# Patient Record
Sex: Female | Born: 1990 | Race: White | Hispanic: No | Marital: Single | Smoking: Never smoker
Health system: Southern US, Community
[De-identification: ages and names within clinical notes are randomized; demographics above are authoritative.]

## PROBLEM LIST (undated history)

## (undated) HISTORY — PX: HIP SURGERY: SHX245

## (undated) HISTORY — PX: CLEFT PALATE REPAIR: SUR1165

---

## 2014-02-16 ENCOUNTER — Emergency Department (HOSPITAL_COMMUNITY): Payer: Medicaid Other

## 2014-02-16 ENCOUNTER — Emergency Department (HOSPITAL_COMMUNITY)
Admission: EM | Admit: 2014-02-16 | Discharge: 2014-02-17 | Disposition: A | Discharge status: Home / Self Care | Payer: Medicaid Other | Attending: Emergency Medicine | Admitting: Emergency Medicine

## 2014-02-16 ENCOUNTER — Encounter (HOSPITAL_COMMUNITY): Payer: Self-pay | Admitting: Emergency Medicine

## 2014-02-16 DIAGNOSIS — N830 Follicular cyst of ovary: Secondary | ICD-10-CM | POA: Diagnosis not present

## 2014-02-16 DIAGNOSIS — R102 Pelvic and perineal pain: Secondary | ICD-10-CM

## 2014-02-16 DIAGNOSIS — Z3202 Encounter for pregnancy test, result negative: Secondary | ICD-10-CM | POA: Diagnosis not present

## 2014-02-16 DIAGNOSIS — R197 Diarrhea, unspecified: Secondary | ICD-10-CM | POA: Insufficient documentation

## 2014-02-16 DIAGNOSIS — N83201 Unspecified ovarian cyst, right side: Secondary | ICD-10-CM

## 2014-02-16 DIAGNOSIS — R109 Unspecified abdominal pain: Secondary | ICD-10-CM | POA: Diagnosis present

## 2014-02-16 LAB — COMPREHENSIVE METABOLIC PANEL
ALT: 9 U/L (ref 0–35)
AST: 13 U/L (ref 0–37)
Albumin: 4.4 g/dL (ref 3.5–5.2)
Alkaline Phosphatase: 69 U/L (ref 39–117)
Anion gap: 13 (ref 5–15)
BUN: 7 mg/dL (ref 6–23)
CALCIUM: 9.3 mg/dL (ref 8.4–10.5)
CO2: 22 meq/L (ref 19–32)
Chloride: 102 mEq/L (ref 96–112)
Creatinine, Ser: 0.8 mg/dL (ref 0.50–1.10)
GFR calc Af Amer: >90 mL/min (ref >90)
GLUCOSE: 86 mg/dL (ref 70–99)
Potassium: 3.7 mEq/L (ref 3.7–5.3)
Sodium: 137 mEq/L (ref 137–147)
TOTAL PROTEIN: 8.2 g/dL (ref 6.0–8.3)
Total Bilirubin: 0.3 mg/dL (ref 0.3–1.2)

## 2014-02-16 LAB — URINALYSIS, ROUTINE W REFLEX MICROSCOPIC
BILIRUBIN URINE: NEGATIVE
Glucose, UA: NEGATIVE mg/dL
KETONES UR: NEGATIVE mg/dL
Leukocytes, UA: NEGATIVE
NITRITE: NEGATIVE
PROTEIN: NEGATIVE mg/dL
SPECIFIC GRAVITY, URINE: 1.01 (ref 1.005–1.030)
UROBILINOGEN UA: 0.2 mg/dL (ref 0.0–1.0)
pH: 6.5 (ref 5.0–8.0)

## 2014-02-16 LAB — CBC WITH DIFFERENTIAL/PLATELET
BASOS ABS: 0 10*3/uL (ref 0.0–0.1)
Basophils Relative: 0 % (ref 0–1)
EOS ABS: 0.3 10*3/uL (ref 0.0–0.7)
EOS PCT: 3 % (ref 0–5)
HEMATOCRIT: 37.4 % (ref 36.0–46.0)
Hemoglobin: 12.5 g/dL (ref 12.0–15.0)
Lymphocytes Relative: 29 % (ref 12–46)
Lymphs Abs: 2.2 10*3/uL (ref 0.7–4.0)
MCH: 27 pg (ref 26.0–34.0)
MCHC: 33.4 g/dL (ref 30.0–36.0)
MCV: 80.8 fL (ref 78.0–100.0)
Monocytes Absolute: 0.4 10*3/uL (ref 0.1–1.0)
Monocytes Relative: 5 % (ref 3–12)
Neutro Abs: 4.8 10*3/uL (ref 1.7–7.7)
Neutrophils Relative %: 63 % (ref 43–77)
Platelets: 244 10*3/uL (ref 150–400)
RBC: 4.63 MIL/uL (ref 3.87–5.11)
RDW: 12.9 % (ref 11.5–15.5)
WBC: 7.6 10*3/uL (ref 4.0–10.5)

## 2014-02-16 LAB — POC URINE PREG, ED: Preg Test, Ur: NEGATIVE

## 2014-02-16 LAB — WET PREP, GENITAL
Trich, Wet Prep: NONE SEEN
Yeast Wet Prep HPF POC: NONE SEEN

## 2014-02-16 LAB — URINE MICROSCOPIC-ADD ON

## 2014-02-16 LAB — LIPASE, BLOOD: Lipase: 29 U/L (ref 11–59)

## 2014-02-16 MED ORDER — IOHEXOL 300 MG/ML  SOLN
50.0000 mL | Freq: Once | INTRAMUSCULAR | Status: AC | PRN
  Administered 2014-02-16: 50 mL via ORAL

## 2014-02-16 MED ORDER — IOHEXOL 300 MG/ML  SOLN
80.0000 mL | Freq: Once | INTRAMUSCULAR | Status: AC | PRN
  Administered 2014-02-16: 80 mL via INTRAVENOUS

## 2014-02-16 NOTE — ED Notes (Signed)
US at bedside

## 2014-02-16 NOTE — ED Notes (Signed)
Pelvic is set up 

## 2014-02-16 NOTE — ED Provider Notes (Signed)
23 year old female, history of lower abdominal pain, on exam the patient initially had right lower quadrant tenderness, see nurse practitioners note for pelvic exam.  Has improved pain with meds, CT shows cyst, on my exam no ttp at this time.  US pending to r/o torsion.  Medical screening examination/treatment/procedure(s) were conducted as a shared visit with non-physician practitioner(s) and myself.  I personally evaluated the patient during the encounter.  Clinical Impression:   Final diagnoses:  Pelvic pain in female         Vida RollerBrian D Kameka Whan, MD 02/18/14 [phone]

## 2014-02-16 NOTE — ED Notes (Signed)
Bed: [id number] Expected date:  Expected time:  Means of arrival:  Comments: Hold for [place]czak

## 2014-02-16 NOTE — ED Notes (Signed)
Pt presents with c/o right side pelvic pain that started approx 4-5 days ago. Pt denies any burning with urination or hematuria. Pt reports some diarrhea but no nausea or vomiting. Pt reports some white vaginal discharge as well. NAD at this time.

## 2014-02-16 NOTE — ED Provider Notes (Signed)
CSN: [id number]     Arrival date & time 02/16/14  1537 History   First MD Initiated Contact with Patient 02/16/14 1822     Chief Complaint  Patient presents with  . Pelvic Pain   (Consider location/radiation/quality/duration/timing/severity/associated sxs/prior Treatment) HPI Gloria Phillips is a 23 yo female presenting with RLQ pain x 5 days.  She states she was cleaning and felt like it hurt to walk, or move.  She notes the pain improved some when lying down, but was still painful.  The next day she had some vaginal spotting, and copious white discharge but does not note an odor.  She denies dysuria but does note pain with intercourse.  Her LMP was in September and she had a week of bleeding and then off and another few days of light bleeding but this is not uncommon since being on her Implanon.  She does endorse loose stools x 3 days, but denies nausea, vomiting, fevers or chills.    History reviewed. No pertinent past medical history. Past Surgical History  Procedure Laterality Date  . Cleft palate repair    . Hip surgery     No family history on file. History  Substance Use Topics  . Smoking status: Never Smoker   . Smokeless tobacco: Not on file  . Alcohol Use: Yes     Comment: rarely    OB History   Grav Para Term Preterm Abortions TAB SAB Ect Mult Living                 Review of Systems  Constitutional: Negative for fever and chills.  HENT: Negative for sore throat.   Eyes: Negative for visual disturbance.  Respiratory: Negative for cough and shortness of breath.   Cardiovascular: Negative for chest pain and leg swelling.  Gastrointestinal: Positive for diarrhea. Negative for nausea and vomiting.  Genitourinary: Positive for vaginal discharge and pelvic pain. Negative for dysuria.  Musculoskeletal: Negative for myalgias.  Skin: Negative for rash.  Neurological: Negative for weakness, numbness and headaches.    Allergies  Other  Home Medications   Prior to  Admission medications   Not on File   BP 112/67  Pulse 102  Temp(Src) 98.6 F (37 C) (Oral)  Resp 16  SpO2 100%  LMP 01/27/2014 Physical Exam  Nursing note and vitals reviewed. Constitutional: She appears well-developed and well-nourished. No distress.  HENT:  Head: Normocephalic and atraumatic.  Mouth/Throat: Oropharynx is clear and moist. No oropharyngeal exudate.  Eyes: Conjunctivae are normal. No scleral icterus.  Neck: Neck supple. No thyromegaly present.  Cardiovascular: Normal rate, regular rhythm, S1 normal, S2 normal and intact distal pulses.  Exam reveals no gallop and no friction rub.   No murmur heard. On exam, heart rate is palpated 92 bpm.  Pulmonary/Chest: Effort normal and breath sounds normal. No respiratory distress. She has no wheezes. She has no rales. She exhibits no tenderness.  Abdominal: Soft. She exhibits no distension and no mass. There is no hepatosplenomegaly. There is tenderness in the right lower quadrant. There is tenderness at McBurney's point. There is no rigidity, no rebound, no guarding, no CVA tenderness and negative Murphy's sign.    Palpation at LLQ and periumbilical causes pain in RLQ.  Genitourinary: Cervix exhibits discharge. Right adnexum displays tenderness. Left adnexum displays no tenderness and no fullness. Vaginal discharge found.  Musculoskeletal: She exhibits no tenderness.  Lymphadenopathy:    She has no cervical adenopathy.  Neurological: She is alert.  Skin: Skin is warm  and dry. No rash noted. She is not diaphoretic.  Psychiatric: She has a normal mood and affect.    ED Course  Procedures (including critical care time) Labs Review Labs Reviewed  WET PREP, GENITAL - Abnormal; Notable for the following:    Clue Cells Wet Prep HPF POC FEW (*)    WBC, Wet Prep HPF POC FEW (*)    All other components within normal limits  URINALYSIS, ROUTINE W REFLEX MICROSCOPIC - Abnormal; Notable for the following:    APPearance CLOUDY (*)     Hgb urine dipstick MODERATE (*)    All other components within normal limits  URINE MICROSCOPIC-ADD ON - Abnormal; Notable for the following:    Squamous Epithelial / LPF FEW (*)    All other components within normal limits  GC/CHLAMYDIA PROBE AMP  CBC WITH DIFFERENTIAL  COMPREHENSIVE METABOLIC PANEL  LIPASE, BLOOD  RPR  HIV ANTIBODY (ROUTINE TESTING)  POC URINE PREG, ED    Imaging Review US Transvaginal Non-ob  02/16/2014   CLINICAL DATA:  Right-sided pelvic pain for 5 days. Evaluate for ovarian torsion.  EXAM: TRANSABDOMINAL AND TRANSVAGINAL ULTRASOUND OF PELVIS  DOPPLER ULTRASOUND OF OVARIES  TECHNIQUE: Both transabdominal and transvaginal ultrasound examinations of the pelvis were performed. Transabdominal technique was performed for global imaging of the pelvis including uterus, ovaries, adnexal regions, and pelvic cul-de-sac.  It was necessary to proceed with endovaginal exam following the transabdominal exam to visualize the the uterus, endometrium, bilateral ovaries and adnexa. Color and duplex Doppler ultrasound was utilized to evaluate blood flow to the ovaries.  COMPARISON:  CT abdomen and pelvis- 02/16/2014.  FINDINGS: Uterus  Normal size and appearance of the interview did uterus which measures approximately 8.3 x 3.4 x 3.9 cm. No discrete uterine mass.  Endometrium  Normal thickness of the endometrium measuring approximately 0.4 cm in diameter. No discrete endometrial mass.  Right ovary  Enlarged measures 6.9 x 5.5 x 6.7 cm, secondary to a large (approximately 5.6 x 5.5 x 5.3 cm) ovarian largely anechoic lesion demonstrates low level echoes. Normal low resistance arterial and venous waveforms are demonstrated within in the surrounding normal ovarian tissue.  Left ovary  Normal in size measuring 2.4 x 2.4 x 1 1.0 cm. Several tiny sub cm follicles are noted within the periphery of the left ovary. Normal low resistance arterial and venous waveforms are demonstrated within in the left  ovary.  Other findings  No free fluid.  IMPRESSION: 1. No evidence of ovarian torsion. 2. There is an approximately 5.6 cm right-sided adnexal lesion which demonstrates low-level internal echoes and while potentially representative of an endometrioma, a minimally complex hemorrhagic cyst could have a similar appearance. As such, a follow-up pelvic ultrasound in 6 weeks is recommended to ensure stability and/or resolution.   Electronically Signed   By: Simonne Come M.D.   On: 02/16/2014 23:50   US Pelvis Complete  02/16/2014   CLINICAL DATA:  Right-sided pelvic pain for 5 days. Evaluate for ovarian torsion.  EXAM: TRANSABDOMINAL AND TRANSVAGINAL ULTRASOUND OF PELVIS  DOPPLER ULTRASOUND OF OVARIES  TECHNIQUE: Both transabdominal and transvaginal ultrasound examinations of the pelvis were performed. Transabdominal technique was performed for global imaging of the pelvis including uterus, ovaries, adnexal regions, and pelvic cul-de-sac.  It was necessary to proceed with endovaginal exam following the transabdominal exam to visualize the the uterus, endometrium, bilateral ovaries and adnexa. Color and duplex Doppler ultrasound was utilized to evaluate blood flow to the ovaries.  COMPARISON:  CT abdomen  and pelvis- 02/16/2014.  FINDINGS: Uterus  Normal size and appearance of the interview did uterus which measures approximately 8.3 x 3.4 x 3.9 cm. No discrete uterine mass.  Endometrium  Normal thickness of the endometrium measuring approximately 0.4 cm in diameter. No discrete endometrial mass.  Right ovary  Enlarged measures 6.9 x 5.5 x 6.7 cm, secondary to a large (approximately 5.6 x 5.5 x 5.3 cm) ovarian largely anechoic lesion demonstrates low level echoes. Normal low resistance arterial and venous waveforms are demonstrated within in the surrounding normal ovarian tissue.  Left ovary  Normal in size measuring 2.4 x 2.4 x 1 1.0 cm. Several tiny sub cm follicles are noted within the periphery of the left ovary.  Normal low resistance arterial and venous waveforms are demonstrated within in the left ovary.  Other findings  No free fluid.  IMPRESSION: 1. No evidence of ovarian torsion. 2. There is an approximately 5.6 cm right-sided adnexal lesion which demonstrates low-level internal echoes and while potentially representative of an endometrioma, a minimally complex hemorrhagic cyst could have a similar appearance. As such, a follow-up pelvic ultrasound in 6 weeks is recommended to ensure stability and/or resolution.   Electronically Signed   By: Simonne Come M.D.   On: 02/16/2014 23:50   Ct Abdomen Pelvis W Contrast  02/16/2014   CLINICAL DATA:  Right lower quadrant pelvic pain.  Initial encounter  EXAM: CT ABDOMEN AND PELVIS WITH CONTRAST  TECHNIQUE: Multidetector CT imaging of the abdomen and pelvis was performed using the standard protocol following bolus administration of intravenous contrast.  CONTRAST:  80mL OMNIPAQUE IOHEXOL 300 MG/ML SOLN, 50mL OMNIPAQUE IOHEXOL 300 MG/ML SOLN  COMPARISON:  None.  FINDINGS: BODY WALL: Unremarkable.  LOWER CHEST: Unremarkable.  ABDOMEN/PELVIS:  Liver: No focal abnormality.  Biliary: No evidence of biliary obstruction or stone.  Pancreas: Unremarkable.  Spleen: Unremarkable.  Adrenals: Unremarkable.  Kidneys and ureters: No hydronephrosis or stone.  Bladder: Unremarkable.  Reproductive: 6 cm right adnexal cyst. The gonadal vessels are displaced posteriorly. No neighboring edematous appearing ovary identified. The uterus and left ovary are unremarkable.  Bowel: No obstruction. The appendix is identified with moderate certainty and is negative for appendicitis.  Retroperitoneum: No mass or adenopathy.  Peritoneum: No ascites or pneumoperitoneum.  Vascular: No acute abnormality.  OSSEOUS: No acute abnormalities.  IMPRESSION: 1. 6 cm right adnexal cyst. Pelvic ultrasound follow-up is recommended. If ovarian torsion is a clinical possibility, recommend this be performed on an urgent  basis. 2. Negative appendix.   Electronically Signed   By: Tiburcio Pea M.D.   On: 02/16/2014 21:50   [place] Art/ven Flow Abd Pelv Doppler  02/16/2014   CLINICAL DATA:  Right-sided pelvic pain for 5 days. Evaluate for ovarian torsion.  EXAM: TRANSABDOMINAL AND TRANSVAGINAL ULTRASOUND OF PELVIS  DOPPLER ULTRASOUND OF OVARIES  TECHNIQUE: Both transabdominal and transvaginal ultrasound examinations of the pelvis were performed. Transabdominal technique was performed for global imaging of the pelvis including uterus, ovaries, adnexal regions, and pelvic cul-de-sac.  It was necessary to proceed with endovaginal exam following the transabdominal exam to visualize the the uterus, endometrium, bilateral ovaries and adnexa. Color and duplex Doppler ultrasound was utilized to evaluate blood flow to the ovaries.  COMPARISON:  CT abdomen and pelvis- 02/16/2014.  FINDINGS: Uterus  Normal size and appearance of the interview did uterus which measures approximately 8.3 x 3.4 x 3.9 cm. No discrete uterine mass.  Endometrium  Normal thickness of the endometrium measuring approximately 0.4 cm in diameter. No  discrete endometrial mass.  Right ovary  Enlarged measures 6.9 x 5.5 x 6.7 cm, secondary to a large (approximately 5.6 x 5.5 x 5.3 cm) ovarian largely anechoic lesion demonstrates low level echoes. Normal low resistance arterial and venous waveforms are demonstrated within in the surrounding normal ovarian tissue.  Left ovary  Normal in size measuring 2.4 x 2.4 x 1 1.0 cm. Several tiny sub cm follicles are noted within the periphery of the left ovary. Normal low resistance arterial and venous waveforms are demonstrated within in the left ovary.  Other findings  No free fluid.  IMPRESSION: 1. No evidence of ovarian torsion. 2. There is an approximately 5.6 cm right-sided adnexal lesion which demonstrates low-level internal echoes and while potentially representative of an endometrioma, a minimally complex hemorrhagic cyst could  have a similar appearance. As such, a follow-up pelvic ultrasound in 6 weeks is recommended to ensure stability and/or resolution.   Electronically Signed   By: Simonne ComeJohn  Watts M.D.   On: 02/16/2014 23:50     EKG Interpretation None      MDM   Final diagnoses:  Cyst of right ovary   23 yo female presenting with RLQ pain and TTP.  LLQ and periumbilical palpation recreates RLQ pain.  Consider appendicitis, ovarian cyst, ovarian torsion, ectopic pregnancy, or vaginal infection.  UA, Upreg, Pelvic exam, GC/Chlamydia, wet prep, CBC, CMP, Lipase without acute abnormality  Case discussed with Dr. Hyacinth MeekerMiller. Will CT abd/pelvis.   On re-exam, pt reports pain is ok without palpation, but is still TTP to RLQ  9:50PM CT scan negative for appendicitis, but shows 6 cm right adnexal cyst and recommended pelvic ultrasound to rule-out ovarian torsion.    10:55PM Pt's pain improved in the ED, awaiting US results  11:50PM Pelvis US negative for ovarian torsion.    Pt appears safe for discharge.  No acute distress, no complaints prior to discharge. Discharge instructions include symptomatic mgmt for pain, resources to establish care with Ob/gyn for follow-up.  Pt aware of plan and in agreement.  Return precautions provided.    Filed Vitals:   02/16/14 1600 02/16/14 1919 02/16/14 2226  BP: 112/67 104/69 116/68  Pulse: 102 92 110  Temp: 98.6 F (37 C)    TempSrc: Oral    Resp: 16 18 18   SpO2: 100% 100% 100%   Meds given in ED:  Medications  iohexol (OMNIPAQUE) 300 MG/ML solution 50 mL (50 mLs Oral Contrast Given 02/16/14 2127)  iohexol (OMNIPAQUE) 300 MG/ML solution 80 mL (80 mLs Intravenous Contrast Given 02/16/14 2127)    Discharge Medication List as of 02/17/2014 12:27 AM       Harle BattiestElizabeth Piera Downs, NP 02/17/14 2156

## 2014-02-17 LAB — GC/CHLAMYDIA PROBE AMP
CT Probe RNA: NEGATIVE
GC Probe RNA: NEGATIVE

## 2014-02-17 LAB — HIV ANTIBODY (ROUTINE TESTING W REFLEX): HIV 1&2 Ab, 4th Generation: NONREACTIVE

## 2014-02-17 LAB — RPR

## 2014-02-17 NOTE — ED Notes (Signed)
Patient is alert and oriented x3.  She was given DC instructions and follow up visit instructions.  Patient gave verbal understanding. She was DC ambulatory under her own power to home.  V/S stable.  He was not showing any signs of distress on DC 

## 2014-02-17 NOTE — Discharge Instructions (Signed)
Please follow the directions provided.  Be sure to establish care with a OB/GYN, for follow-up on the ovarian cyst found today.  You may take tylenol 650 mg by mouth every 4 hours or ibuprofen 400 mg by mouth every 6 hours for pain.  Don't hesitate to return for new, worsening or concerning symptoms.    SEEK MEDICAL CARE IF:  Your periods are late, irregular, or painful, or they stop.  Your pelvic pain or abdominal pain does not go away.  Your abdomen becomes larger or swollen.  You have pressure on your bladder or trouble emptying your bladder completely.  You have pain during sexual intercourse.  You have feelings of fullness, pressure, or discomfort in your stomach.  You lose weight for no apparent reason.  You feel generally ill.  You become constipated.  You lose your appetite.  You develop acne.  You have an increase in body and facial hair.  You are gaining weight, without changing your exercise and eating habits.  You think you are pregnant.

## 2014-02-18 NOTE — ED Provider Notes (Signed)
Medical screening examination/treatment/procedure(s) were conducted as a shared visit with non-physician practitioner(s) and myself.  I personally evaluated the patient during the encounter  Please see my separate respective documentation pertaining to this patient encounter   Vida RollerBrian D Luzmaria Devaux, MD 02/18/14 [phone]

## 2016-04-02 IMAGING — US US ART/VEN ABD/PELV/SCROTUM DOPPLER LTD
1 series · 13 of 25 positions shown · non-contrast
Comparison: CT abdomen and pelvis- 02/16/2014.

CLINICAL DATA: Right-sided pelvic pain for 5 days. Evaluate for
ovarian torsion.

EXAM:
TRANSABDOMINAL AND TRANSVAGINAL ULTRASOUND OF PELVIS
DOPPLER ULTRASOUND OF OVARIES
TECHNIQUE: Both transabdominal and transvaginal ultrasound examinations of the
pelvis were performed. Transabdominal technique was performed for
global imaging of the pelvis including uterus, ovaries, adnexal
regions, and pelvic cul-de-sac.
It was necessary to proceed with endovaginal exam following the
transabdominal exam to visualize the the uterus, endometrium,
bilateral ovaries and adnexa. Color and duplex Doppler ultrasound
was utilized to evaluate blood flow to the ovaries.

[Series 1: us art/ven abd/pelv/scrotum doppler ltd · 0.20mm/px · 13 of 80 slices shown]
[im 1/80]
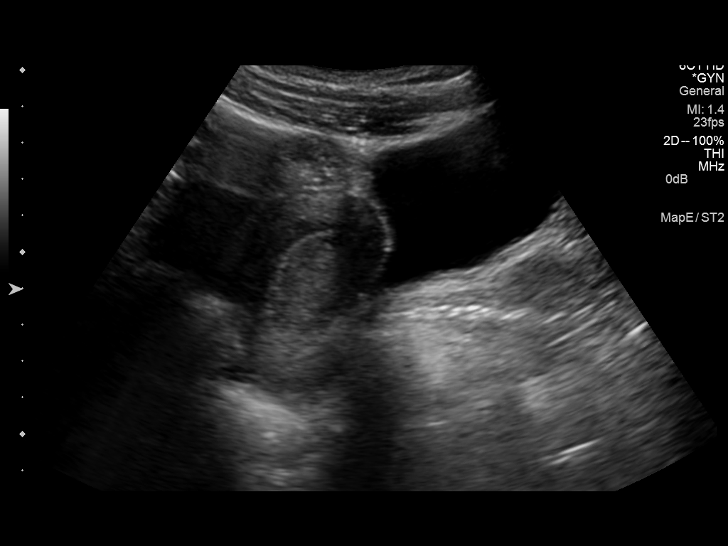
[im 7/80]
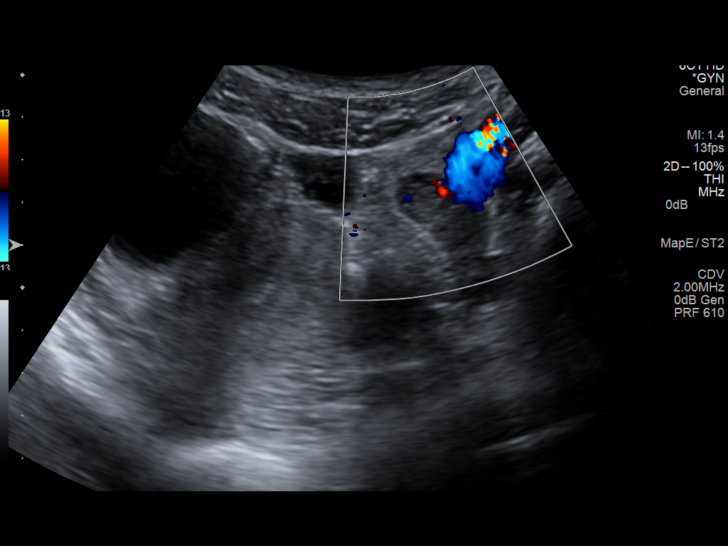
[im 14/80]
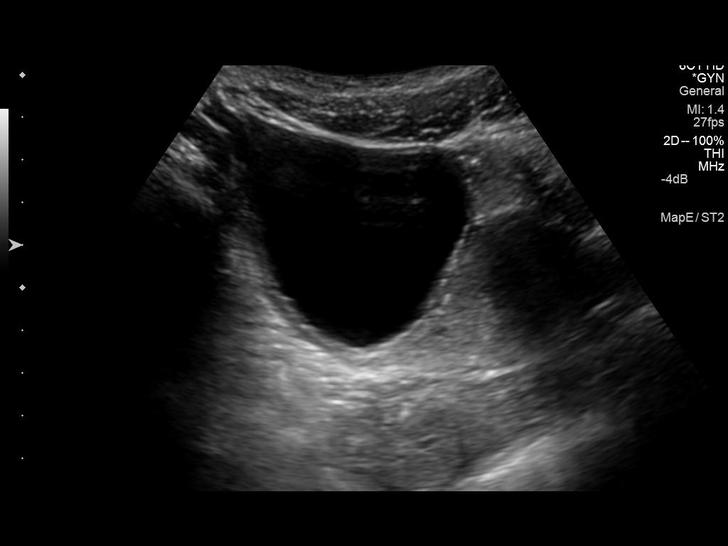
[im 20/80]
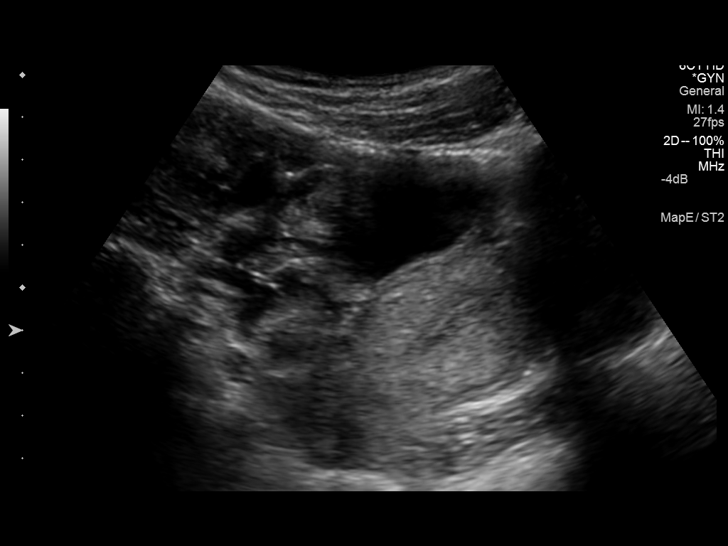
[im 27/80]
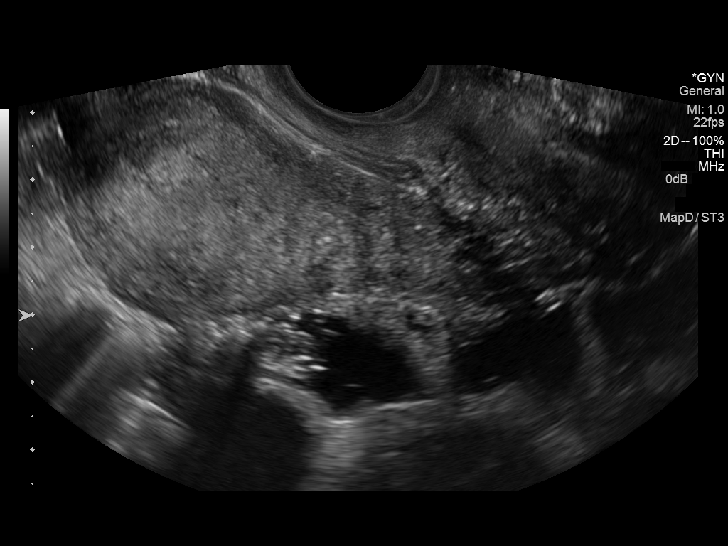
[im 33/80]
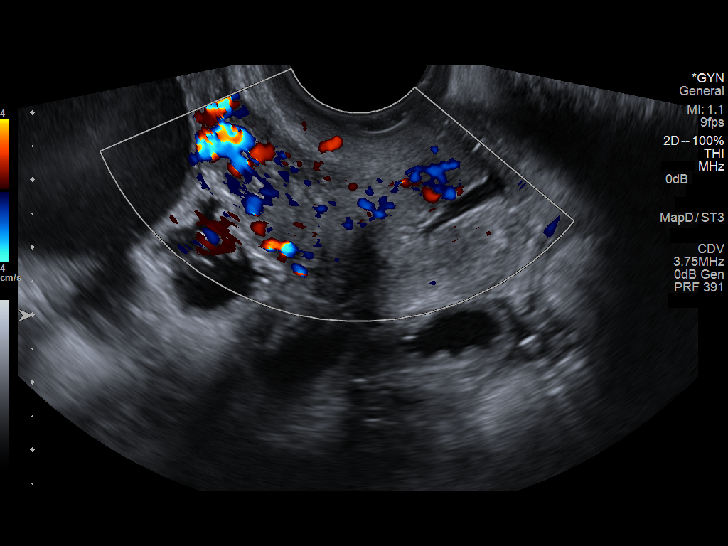
[im 40/80]
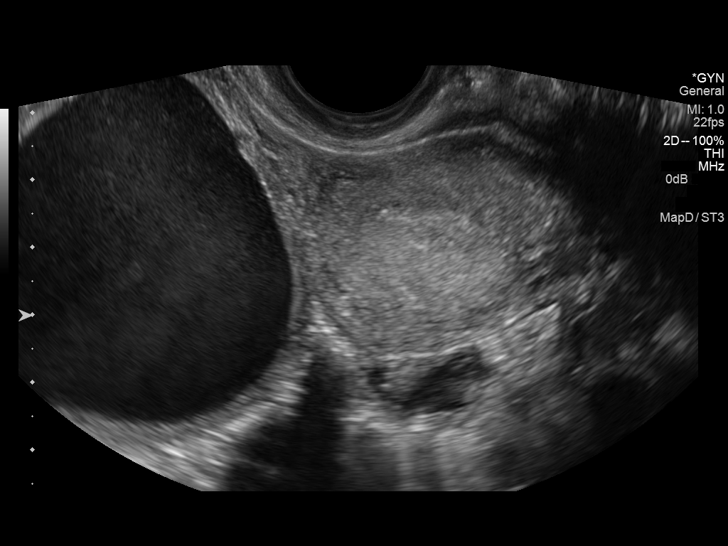
[im 47/80]
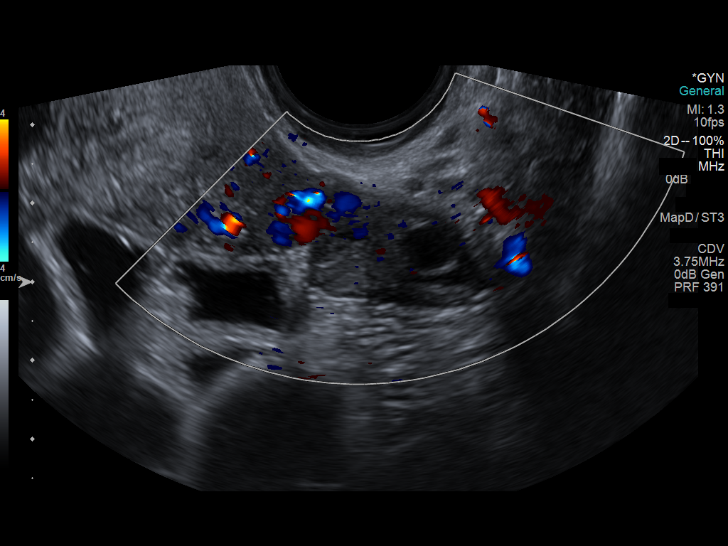
[im 53/80]
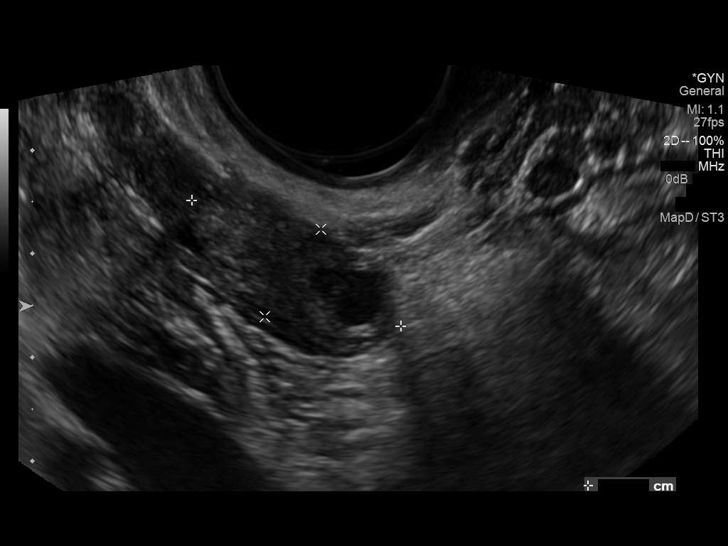
[im 60/80]
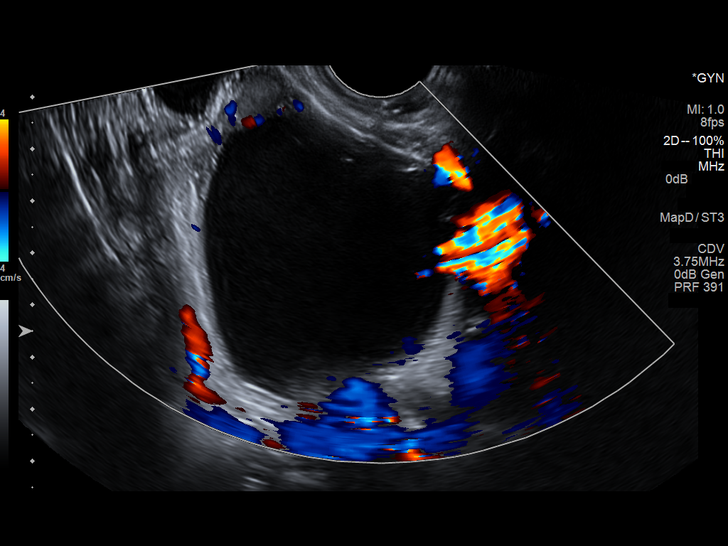
[im 66/80]
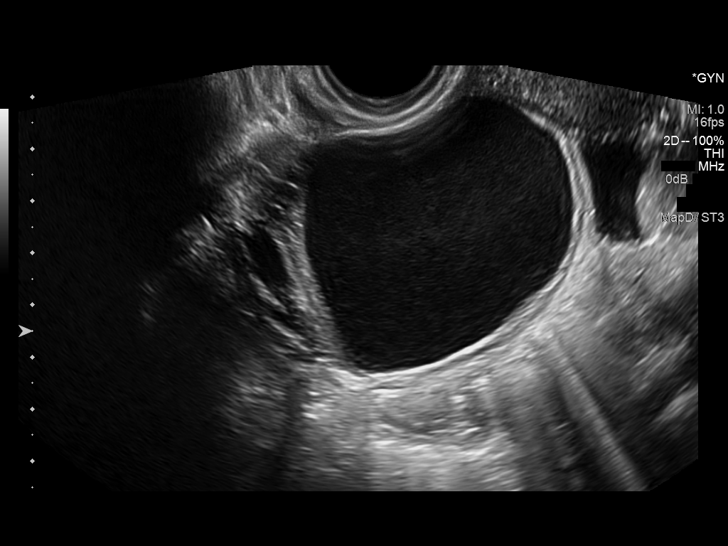
[im 73/80]
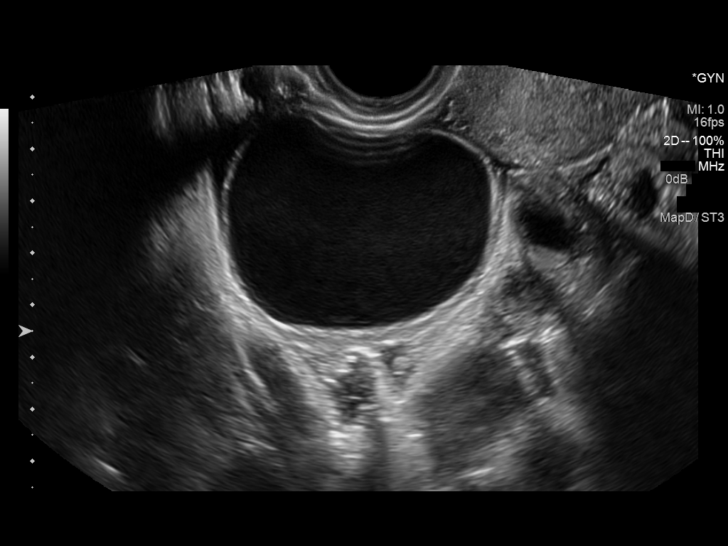
[im 80/80]
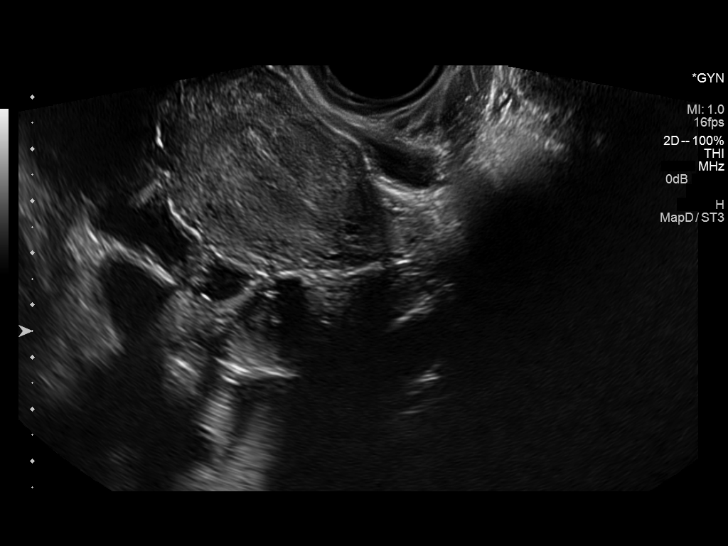

[13 of 25 positions shown; findings below may reference images not displayed]

FINDINGS: Uterus

Normal size and appearance of the interview did uterus which
measures approximately 8.3 x 3.4 x 3.9 cm. No discrete uterine mass.

Endometrium

Normal thickness of the endometrium measuring approximately 0.4 cm
in diameter. No discrete endometrial mass.

Right ovary

Enlarged measures 6.9 x 5.5 x 6.7 cm, secondary to a large
(approximately 5.6 x 5.5 x 5.3 cm) ovarian largely anechoic lesion
demonstrates low level echoes. Normal low resistance arterial and
venous waveforms are demonstrated within in the surrounding normal
ovarian tissue.

Left ovary

Normal in size measuring 2.4 x 2.4 x 1 1.0 cm. Several tiny sub cm
follicles are noted within the periphery of the left ovary. Normal
low resistance arterial and venous waveforms are demonstrated within
in the left ovary.

Other findings

No free fluid.
IMPRESSION: 1. No evidence of ovarian torsion.
2. There is an approximately 5.6 cm right-sided adnexal lesion which
demonstrates low-level internal echoes and while potentially
representative of an endometrioma, a minimally complex hemorrhagic
cyst could have a similar appearance. As such, a follow-up pelvic
ultrasound in 6 weeks is recommended to ensure stability and/or
resolution.
